# Patient Record
Sex: Male | Born: 1995 | Race: Black or African American | Hispanic: No | Marital: Single | Smoking: Never smoker
Health system: Southern US, Community
[De-identification: ages and names within clinical notes are randomized; demographics above are authoritative.]

---
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
## 2014-03-16 ENCOUNTER — Emergency Department: Payer: Self-pay | Admitting: Emergency Medicine

## 2014-04-23 ENCOUNTER — Encounter (HOSPITAL_COMMUNITY): Payer: Self-pay | Admitting: Emergency Medicine

## 2014-04-23 ENCOUNTER — Emergency Department (HOSPITAL_COMMUNITY)
Admission: EM | Admit: 2014-04-23 | Discharge: 2014-04-23 | Disposition: A | Discharge status: Home / Self Care | Payer: Medicaid Other | Attending: Emergency Medicine | Admitting: Emergency Medicine

## 2014-04-23 DIAGNOSIS — J029 Acute pharyngitis, unspecified: Secondary | ICD-10-CM | POA: Insufficient documentation

## 2014-04-23 LAB — RAPID STREP SCREEN (MED CTR MEBANE ONLY): Streptococcus, Group A Screen (Direct): NEGATIVE

## 2014-04-23 MED ORDER — LIDOCAINE VISCOUS 2 % MT SOLN: 20.0000 mL | OROMUCOSAL | Status: DC | PRN

## 2014-04-23 NOTE — ED Provider Notes (Signed)
CSN: [id number]     Arrival date & time 04/23/14  [id number] History  This chart was scribed for a non-physician practitioner, Santiago GladHeather Jazon Jipson PA-C, working with Merrie RoofJohn David Wofford III, * by Julian HyMorgan Graham, ED Scribe. The patient was seen in TR07C/TR07C. The patient's care was started at 11:15 AM.  Chief Complaint  Patient presents with  . Sore Throat   The history is provided by the patient. No language interpreter was used.   HPI Comments: Brian Simpson is a 18 y.o. male who presents to the Emergency Department complaining of gradual onset, constant, gradually worsening sore throat that started 3 days ago. Patient states his symptoms initially began as an itchy throat and progressed to pain. He reports today he noticed white spots to the posterior oropharynx. He has associated left-sided otalgia and subjective fever several days ago. Pt has taken Tylenol with relief a couple of days ago, but denies taking any medications recently. Pt denies previous episodes of strep throat. Patient reports his girlfriend has similar symptoms but was not diagnosed with strep throat. Pt denies chills, nausea, vomiting, difficulty swallowing, cough, rhinorrhea,congestion, or rash.   History  Substance Use Topics  . Smoking status: Never Smoker   . Smokeless tobacco: Not on file  . Alcohol Use: No    Review of Systems  Constitutional: Negative for fever and chills.  HENT: Positive for sore throat. Negative for drooling, rhinorrhea and trouble swallowing.   Respiratory: Negative for cough.   Gastrointestinal: Negative for nausea and vomiting.  Skin: Negative for rash.  All other systems reviewed and are negative.  Allergies  Review of patient's allergies indicates no known allergies.  Home Medications   Prior to Admission medications   Medication Sig Start Date End Date Taking? Authorizing Provider  PRESCRIPTION MEDICATION Take 1 tablet by mouth daily as needed (chest pain).   Yes Historical Provider, MD    Triage Vitals: BP 115/57  Pulse 65  Temp(Src) 97.4 F (36.3 C) (Oral)  Resp 20  SpO2 100%  Physical Exam  Nursing note and vitals reviewed. Constitutional: He is oriented to person, place, and time. He appears well-developed and well-nourished. No distress.  HENT:  Head: Normocephalic and atraumatic.  Right Ear: Tympanic membrane and ear canal normal.  Left Ear: Tympanic membrane and ear canal normal.  Mouth/Throat: Uvula is midline. No trismus in the jaw. Oropharyngeal exudate (on both tonsils) present.  Polyp noted to the left nare. Bilateral tonsillar enlargement. Exudate present on bilateral tonsils. Normal voice phonation. Not drooling, handling secretions well. Anterior cervical lymphadenopathy noted.    Eyes: Conjunctivae and EOM are normal.  Neck: Neck supple. No tracheal deviation present.  Cardiovascular: Normal rate, regular rhythm and normal heart sounds.   Pulmonary/Chest: Effort normal and breath sounds normal. No respiratory distress.  Musculoskeletal: Normal range of motion.  Lymphadenopathy:    He has cervical adenopathy.  Neurological: He is alert and oriented to person, place, and time.  Skin: Skin is warm and dry.  Psychiatric: He has a normal mood and affect. His behavior is normal.    ED Course  Procedures (including critical care time) DIAGNOSTIC STUDIES: Oxygen Saturation is 100% on room air, normal by my interpretation.    COORDINATION OF CARE: 11:19 AM- Patient informed of current plan for treatment and evaluation and agrees with plan at this time.  Labs Review Labs Reviewed  RAPID STREP SCREEN  CULTURE, GROUP A STREP   MDM   Final diagnoses:  None    Patient  presenting with sore throat.  Patient is afebrile.  Rapid strep is negative. Culture sent.   No signs of Peritonsillar or Retropharyngeal Abscess.  Patient stable for discharge.  Return precautions given.   Santiago GladHeather Bentley Haralson, PA-C 04/24/14 2326

## 2014-04-23 NOTE — ED Notes (Signed)
Patient states that he has sore throat with "white patchy stuff in the back".  Patient states his girlfriend recently had strep.

## 2014-04-25 LAB — CULTURE, GROUP A STREP

## 2014-04-25 NOTE — ED Provider Notes (Signed)
Medical screening examination/treatment/procedure(s) were performed by non-physician practitioner and as supervising physician I was immediately available for consultation/collaboration.   Candyce ChurnJohn David Wofford III, MD 04/25/14 [phone]

## 2014-06-07 ENCOUNTER — Emergency Department: Payer: Self-pay | Admitting: Emergency Medicine

## 2014-07-31 ENCOUNTER — Emergency Department (HOSPITAL_COMMUNITY)
Admission: EM | Admit: 2014-07-31 | Discharge: 2014-07-31 | Disposition: A | Discharge status: Home / Self Care | Payer: Medicaid Other | Attending: Emergency Medicine | Admitting: Emergency Medicine

## 2014-07-31 ENCOUNTER — Encounter (HOSPITAL_COMMUNITY): Payer: Self-pay | Admitting: Emergency Medicine

## 2014-07-31 DIAGNOSIS — Z791 Long term (current) use of non-steroidal anti-inflammatories (NSAID): Secondary | ICD-10-CM | POA: Insufficient documentation

## 2014-07-31 DIAGNOSIS — R197 Diarrhea, unspecified: Secondary | ICD-10-CM | POA: Diagnosis not present

## 2014-07-31 DIAGNOSIS — R112 Nausea with vomiting, unspecified: Secondary | ICD-10-CM | POA: Diagnosis not present

## 2014-07-31 DIAGNOSIS — Z79899 Other long term (current) drug therapy: Secondary | ICD-10-CM | POA: Insufficient documentation

## 2014-07-31 DIAGNOSIS — S3992XA Unspecified injury of lower back, initial encounter: Secondary | ICD-10-CM | POA: Diagnosis present

## 2014-07-31 DIAGNOSIS — S39012A Strain of muscle, fascia and tendon of lower back, initial encounter: Secondary | ICD-10-CM

## 2014-07-31 DIAGNOSIS — X58XXXA Exposure to other specified factors, initial encounter: Secondary | ICD-10-CM | POA: Insufficient documentation

## 2014-07-31 DIAGNOSIS — S335XXA Sprain of ligaments of lumbar spine, initial encounter: Secondary | ICD-10-CM | POA: Diagnosis not present

## 2014-07-31 LAB — COMPREHENSIVE METABOLIC PANEL
ALBUMIN: 3.9 g/dL (ref 3.5–5.2)
ALK PHOS: 48 U/L (ref 39–117)
ALT: 18 U/L (ref 0–53)
AST: 20 U/L (ref 0–37)
Anion gap: 12 (ref 5–15)
BUN: 16 mg/dL (ref 6–23)
CO2: 26 mEq/L (ref 19–32)
Calcium: 9.2 mg/dL (ref 8.4–10.5)
Chloride: 102 mEq/L (ref 96–112)
Creatinine, Ser: 1.05 mg/dL (ref 0.50–1.35)
GFR calc Af Amer: >90 mL/min (ref >90)
GFR calc non Af Amer: >90 mL/min (ref >90)
GLUCOSE: 136 mg/dL — AB (ref 70–99)
Potassium: 3.3 mEq/L — ABNORMAL LOW (ref 3.7–5.3)
SODIUM: 140 meq/L (ref 137–147)
TOTAL PROTEIN: 7.2 g/dL (ref 6.0–8.3)
Total Bilirubin: 1.2 mg/dL (ref 0.3–1.2)

## 2014-07-31 LAB — CBC WITH DIFFERENTIAL/PLATELET
Basophils Absolute: 0 10*3/uL (ref 0.0–0.1)
Basophils Relative: 0 % (ref 0–1)
EOS ABS: 0.1 10*3/uL (ref 0.0–0.7)
Eosinophils Relative: 2 % (ref 0–5)
HCT: 41.7 % (ref 39.0–52.0)
Hemoglobin: 14.3 g/dL (ref 13.0–17.0)
LYMPHS ABS: 1 10*3/uL (ref 0.7–4.0)
Lymphocytes Relative: 20 % (ref 12–46)
MCH: 27.9 pg (ref 26.0–34.0)
MCHC: 34.3 g/dL (ref 30.0–36.0)
MCV: 81.3 fL (ref 78.0–100.0)
Monocytes Absolute: 0.5 10*3/uL (ref 0.1–1.0)
Monocytes Relative: 10 % (ref 3–12)
NEUTROS PCT: 68 % (ref 43–77)
Neutro Abs: 3.3 10*3/uL (ref 1.7–7.7)
PLATELETS: 212 10*3/uL (ref 150–400)
RBC: 5.13 MIL/uL (ref 4.22–5.81)
RDW: 13 % (ref 11.5–15.5)
WBC: 4.8 10*3/uL (ref 4.0–10.5)

## 2014-07-31 LAB — URINALYSIS, ROUTINE W REFLEX MICROSCOPIC
Bilirubin Urine: NEGATIVE
GLUCOSE, UA: NEGATIVE mg/dL
HGB URINE DIPSTICK: NEGATIVE
Ketones, ur: NEGATIVE mg/dL
Leukocytes, UA: NEGATIVE
Nitrite: NEGATIVE
Protein, ur: NEGATIVE mg/dL
SPECIFIC GRAVITY, URINE: 1.022 (ref 1.005–1.030)
Urobilinogen, UA: 4 mg/dL — ABNORMAL HIGH (ref 0.0–1.0)
pH: 6.5 (ref 5.0–8.0)

## 2014-07-31 LAB — LIPASE, BLOOD: Lipase: 11 U/L (ref 11–59)

## 2014-07-31 MED ORDER — POTASSIUM CHLORIDE CRYS ER 20 MEQ PO TBCR
40.0000 meq | EXTENDED_RELEASE_TABLET | Freq: Once | ORAL | Status: AC
  Administered 2014-07-31: 40 meq via ORAL
  Filled 2014-07-31: qty 2

## 2014-07-31 MED ORDER — NAPROXEN 500 MG PO TABS
500.0000 mg | ORAL_TABLET | Freq: Two times a day (BID) | ORAL | Status: AC
Start: 2014-07-31 — End: ?

## 2014-07-31 MED ORDER — CYCLOBENZAPRINE HCL 10 MG PO TABS: 10.0000 mg | ORAL_TABLET | Freq: Two times a day (BID) | ORAL | Status: AC | PRN

## 2014-07-31 NOTE — ED Provider Notes (Signed)
CSN: [id number]     Arrival date & time 07/31/14  1317 History   First MD Initiated Contact with Patient 07/31/14 1521     Chief Complaint  Patient presents with  . Emesis  . Diarrhea  . Back Pain     (Consider location/radiation/quality/duration/timing/severity/associated sxs/prior Treatment) HPI  Pt. presents today with complaints of N/V/D and central back pain. Nausea, vomiting and diarrhea started yesterday with one episode of vomiting and five episodes of diarrhea. Vomiting has completely stopped but notes one episode of diarrhea this morning. Diarrhea is watery with out blood present. He was able to keep down liquids and food today. Brother in law was recently sick with the same thing. Back pain started when he started to feel sick, it is located in lumbar region centrally and sometimes travels up his back to thoracic region. 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 Pain in back is about a 7 today and gets worse with movement. Has tried tylenol over the counter with little relief. Denies numbness or tingling in legs or loss of bowel and bladder control.    History reviewed. No pertinent past medical history. History reviewed. No pertinent past surgical history. History reviewed. No pertinent family history. History  Substance Use Topics  . Smoking status: Never Smoker   . Smokeless tobacco: Not on file  . Alcohol Use: No    Review of Systems  All other systems reviewed and are negative.     Allergies  Review of patient's allergies indicates no known allergies.  Home Medications   Prior to Admission medications   Medication Sig Start Date End Date Taking? Authorizing Provider  bismuth subsalicylate (PEPTO BISMOL) 262 MG/15ML suspension Take 30 mLs by mouth every 6 (six) hours as needed for indigestion.   Yes Historical Provider, MD  cyclobenzaprine (FLEXERIL) 10 MG tablet Take 1 tablet (10 mg total) by mouth 2 (two)  times daily as needed for muscle spasms. 07/31/14   Cia Garretson Irine Seal, PA-C  naproxen (NAPROSYN) 500 MG tablet Take 1 tablet (500 mg total) by mouth 2 (two) times daily. 07/31/14   Adib Wahba Irine Seal, PA-C   BP 128/73  Pulse 86  Temp(Src) 98.5 F (36.9 C) (Oral)  Resp 15  Ht 5\' 8"  (1.727 m)  Wt 180 lb (81.647 kg)  BMI 27.38 kg/m2  SpO2 97% Physical Exam  Nursing note and vitals reviewed. Constitutional: He appears well-developed and well-nourished. No distress.  HENT:  Head: Normocephalic and atraumatic.  Eyes: Pupils are equal, round, and reactive to light.  Neck: Normal range of motion. Neck supple.  Cardiovascular: Normal rate and regular rhythm.   Pulmonary/Chest: Effort normal and breath sounds normal.  Abdominal: Soft. Bowel sounds are normal. He exhibits no distension and no fluid wave. There is no tenderness. There is no rebound, no guarding and no CVA tenderness.  Musculoskeletal:       Arms: Pt has equal strength to bilateral lower extremities.  Neurosensory function adequate to both legs No clonus on dorsiflextion Skin color is normal. Skin is warm and moist.  I see no step off deformity, no midline bony tenderness.  Pt is able to ambulate.  No crepitus, laceration, effusion, induration, lesions, swelling.   Pedal pulses are symmetrical and palpable bilaterally  mild tenderness to palpation of thoracic and lumbar paraspinal muscles   Neurological: He is alert.  Skin: Skin is warm and dry.    ED Course  Procedures (including critical care time) Labs Review Labs Reviewed  COMPREHENSIVE METABOLIC PANEL - Abnormal; Notable for the following:    Potassium 3.3 (*)    Glucose, Bld 136 (*)    All other components within normal limits  URINALYSIS, ROUTINE W REFLEX MICROSCOPIC - Abnormal; Notable for the following:    Color, Urine AMBER (*)    Urobilinogen, UA 4.0 (*)    All other components within normal limits  CBC WITH DIFFERENTIAL  LIPASE, BLOOD    Imaging  Review No results found.   EKG Interpretation None      MDM   Final diagnoses:  Nausea vomiting and diarrhea  Low back strain, initial encounter    The patient had enteritis like symptoms yesterday that have since resolved. HE denies any belly pain and has a normally abdominal exam. He reports that he can not miss anymore days of work and that his low back pain will prevent him from liftng heavy boxes. He requests a work note. His urolobilirubin is elevated on urine test. But his LFTs and Lipase are normal. I have recommended he have his PCP recheck this. At this time I do not feel that further emergent work up is required.   Medications  potassium chloride SA (K-DUR,KLOR-CON) CR tablet 40 mEq (not administered)   cyclobenzaprine (FLEXERIL) 10 MG tablet Take 1 tablet (10 mg total) by mouth 2 (two) times daily as needed for muscle spasms. 20 tablet Dorthula Matasiffany G Dequane Strahan, PA-C   naproxen (NAPROSYN) 500 MG tablet Take 1 tablet (500 mg total) by mouth 2 (two) times daily. 30 tablet Dorthula Matasiffany G Toshio Slusher, PA-C   18 y.o.Sissy Hofferry N Sizemore's evaluation in the Emergency Department is complete. It has been determined that no acute conditions requiring further emergency intervention are present at this time. The patient/guardian have been advised of the diagnosis and plan. We have discussed signs and symptoms that warrant return to the ED, such as changes or worsening in symptoms.  Vital signs are stable at discharge. Filed Vitals:   07/31/14 1328  BP: 128/73  Pulse: 86  Temp: 98.5 F (36.9 C)  Resp: 15    Patient/guardian has voiced understanding and agreed to follow-up with the PCP or specialist.   Dorthula Matasiffany G Shannel Zahm, PA-C 07/31/14 1637

## 2014-07-31 NOTE — Discharge Instructions (Signed)
Back Pain, Adult °Low back pain is very common. About 1 in 5 people have back pain. The cause of low back pain is rarely dangerous. The pain often gets better over time. About half of people with a sudden onset of back pain feel better in just 2 weeks. About 8 in 10 people feel better by 6 weeks.  °CAUSES °Some common causes of back pain include: °· Strain of the muscles or ligaments supporting the spine. °· Wear and tear (degeneration) of the spinal discs. °· Arthritis. °· Direct injury to the back. °DIAGNOSIS °Most of the time, the direct cause of low back pain is not known. However, back pain can be treated effectively even when the exact cause of the pain is unknown. Answering your caregiver's questions about your overall health and symptoms is one of the most accurate ways to make sure the cause of your pain is not dangerous. If your caregiver needs more information, he or she may order lab work or imaging tests (X-rays or MRIs). However, even if imaging tests show changes in your back, this usually does not require surgery. °HOME CARE INSTRUCTIONS °For many people, back pain returns. Since low back pain is rarely dangerous, it is often a condition that people can learn to manage on their own.  °· Remain active. It is stressful on the back to sit or stand in one place. Do not sit, drive, or stand in one place for more than 30 minutes at a time. Take short walks on level surfaces as soon as pain allows. Try to increase the length of time you walk each day. °· Do not stay in bed. Resting more than 1 or 2 days can delay your recovery. °· Do not avoid exercise or work. Your body is made to move. It is not dangerous to be active, even though your back may hurt. Your back will likely heal faster if you return to being active before your pain is gone. °· Pay attention to your body when you  bend and lift. Many people have less discomfort when lifting if they bend their knees, keep the load close to their bodies, and  avoid twisting. Often, the most comfortable positions are those that put less stress on your recovering back. °· Find a comfortable position to sleep. Use a firm mattress and lie on your side with your knees slightly bent. If you lie on your back, put a pillow under your knees. °· Only take over-the-counter or prescription medicines as directed by your caregiver. Over-the-counter medicines to reduce pain and inflammation are often the most helpful. Your caregiver may prescribe muscle relaxant drugs. These medicines help dull your pain so you can more quickly return to your normal activities and healthy exercise. °· Put ice on the injured area. °¨ Put ice in a plastic bag. °¨ Place a towel between your skin and the bag. °¨ Leave the ice on for 15-20 minutes, 03-04 times a day for the first 2 to 3 days. After that, ice and heat may be alternated to reduce pain and spasms. °· Ask your caregiver about trying back exercises and gentle massage. This may be of some benefit. °· Avoid feeling anxious or stressed. Stress increases muscle tension and can worsen back pain. It is important to recognize when you are anxious or stressed and learn ways to manage it. Exercise is a great option. °SEEK MEDICAL CARE IF: °· You have pain that is not relieved with rest or medicine. °· You have pain that does not improve in 1 week. °· You have new symptoms. °· You are generally not feeling well. °SEEK   IMMEDIATE MEDICAL CARE IF:   You have pain that radiates from your back into your legs.  You develop new bowel or bladder control problems.  You have unusual weakness or numbness in your arms or legs.  You develop nausea or vomiting.  You develop abdominal pain.  You feel faint. Document Released: 10/12/2005 Document Revised: 04/12/2012 Document Reviewed: 02/13/2014 [facility] Patient Information 2015 [place], [place]. This information is not intended to replace advice given to you by your health care provider. Make sure you  discuss any questions you have with your health care provider.   Food Choices to Help Relieve Diarrhea When you have diarrhea, the foods you eat and your eating habits are very important. Choosing the right foods and drinks can help relieve diarrhea. Also, because diarrhea can last up to 7 days, you need to replace lost fluids and electrolytes (such as sodium, potassium, and chloride) in order to help prevent dehydration.  WHAT GENERAL GUIDELINES DO I NEED TO FOLLOW?  Slowly drink 1 cup (8 oz) of fluid for each episode of diarrhea. If you are getting enough fluid, your urine will be clear or pale yellow.  Eat starchy foods. Some good choices include white rice, white toast, pasta, low-fiber cereal, baked potatoes (without the skin), saltine crackers, and bagels.  Avoid large servings of any cooked vegetables.  Limit fruit to two servings per day. A serving is  cup or 1 small piece.  Choose foods with less than 2 g of fiber per serving.  Limit fats to less than 8 tsp (38 g) per day.  Avoid fried foods.  Eat foods that have probiotics in them. Probiotics can be found in certain dairy products.  Avoid foods and beverages that may increase the speed at which food moves through the stomach and intestines (gastrointestinal tract). Things to avoid include:  High-fiber foods, such as dried fruit, raw fruits and vegetables, nuts, seeds, and whole grain foods.  Spicy foods and high-fat foods.  Foods and beverages sweetened with high-fructose corn syrup, honey, or sugar alcohols such as xylitol, sorbitol, and mannitol. WHAT FOODS ARE RECOMMENDED? Grains White rice. White, [place], or pita breads (fresh or toasted), including plain rolls, buns, or bagels. White pasta. Saltine, soda, or graham crackers. Pretzels. Low-fiber cereal. Cooked cereals made with water (such as cornmeal, farina, or cream cereals). Plain muffins. Matzo. Melba toast. Zwieback.  Vegetables Potatoes (without the skin).  Strained tomato and vegetable juices. Most well-cooked and canned vegetables without seeds. Tender lettuce. Fruits Cooked or canned applesauce, apricots, cherries, fruit cocktail, grapefruit, peaches, pears, or plums. Fresh bananas, apples without skin, cherries, grapes, cantaloupe, grapefruit, peaches, oranges, or plums.  Meat and Other Protein Products Baked or boiled chicken. Eggs. Tofu. Fish. Seafood. Smooth peanut butter. Ground or well-cooked tender beef, ham, veal, lamb, pork, or poultry.  Dairy Plain yogurt, kefir, and unsweetened liquid yogurt. Lactose-free milk, buttermilk, or soy milk. Plain hard cheese. Beverages Sport drinks. Clear broths. Diluted fruit juices (except prune). Regular, caffeine-free sodas such as ginger ale. Water. Decaffeinated teas. Oral rehydration solutions. Sugar-free beverages not sweetened with sugar alcohols. Other Bouillon, broth, or soups made from recommended foods.  The items listed above may not be a complete list of recommended foods or beverages. Contact your dietitian for more options. WHAT FOODS ARE NOT RECOMMENDED? Grains Whole grain, whole wheat, bran, or rye breads, rolls, pastas, crackers, and cereals. Wild or brown rice. Cereals that contain more than 2 g of fiber per serving. Corn tortillas or taco shells.  Cooked or dry oatmeal. Granola. Popcorn. Vegetables Raw vegetables. Cabbage, broccoli, Brussels sprouts, artichokes, baked beans, beet greens, corn, kale, legumes, peas, sweet potatoes, and yams. Potato skins. Cooked spinach and cabbage. Fruits Dried fruit, including raisins and dates. Raw fruits. Stewed or dried prunes. Fresh apples with skin, apricots, mangoes, pears, raspberries, and strawberries.  Meat and Other Protein Products Chunky peanut butter. Nuts and seeds. Beans and lentils. Tomasa Blase.  Dairy High-fat cheeses. Milk, chocolate milk, and beverages made with milk, such as milk shakes. Cream. Ice cream. Sweets and Desserts Sweet  rolls, doughnuts, and sweet breads. Pancakes and waffles. Fats and Oils Butter. Cream sauces. Margarine. Salad oils. Plain salad dressings. Olives. Avocados.  Beverages Caffeinated beverages (such as coffee, tea, soda, or energy drinks). Alcoholic beverages. Fruit juices with pulp. Prune juice. Soft drinks sweetened with high-fructose corn syrup or sugar alcohols. Other Coconut. Hot sauce. Chili powder. Mayonnaise. Gravy. Cream-based or milk-based soups.  The items listed above may not be a complete list of foods and beverages to avoid. Contact your dietitian for more information. WHAT SHOULD I DO IF I BECOME DEHYDRATED? Diarrhea can sometimes lead to dehydration. Signs of dehydration include dark urine and dry mouth and skin. If you think you are dehydrated, you should rehydrate with an oral rehydration solution. These solutions can be purchased at pharmacies, retail stores, or online.  Drink -1 cup (120-240 mL) of oral rehydration solution each time you have an episode of diarrhea. If drinking this amount makes your diarrhea worse, try drinking smaller amounts more often. For example, drink 1-3 tsp (5-15 mL) every 5-10 minutes.  A general rule for staying hydrated is to drink 1-2 L of fluid per day. Talk to your health care provider about the specific amount you should be drinking each day. Drink enough fluids to keep your urine clear or pale yellow. Document Released: 01/02/2004 Document Revised: 10/17/2013 Document Reviewed: 09/04/2013 [facility] Patient Information 2015 [place], [place]. This information is not intended to replace advice given to you by your health care provider. Make sure you discuss any questions you have with your health care provider.

## 2014-07-31 NOTE — ED Notes (Signed)
Pt c/o N/V/D from possible stomach virus; pt sts pain in back also

## 2014-08-01 NOTE — ED Provider Notes (Signed)
Medical screening examination/treatment/procedure(s) were performed by non-physician practitioner and as supervising physician I was immediately available for consultation/collaboration.   EKG Interpretation None        Brelyn Woehl, MD 08/01/14 1149 

## 2016-05-20 IMAGING — CR DG CHEST 2V
1 series · 2 of 2 positions shown · non-contrast
Comparison: None.

CLINICAL DATA: Chest tightness

EXAM:
CHEST  2 VIEW

[Series 1: w chest pa · 0.14mm/px · 2 of 2 slices shown]
[im 1/2]
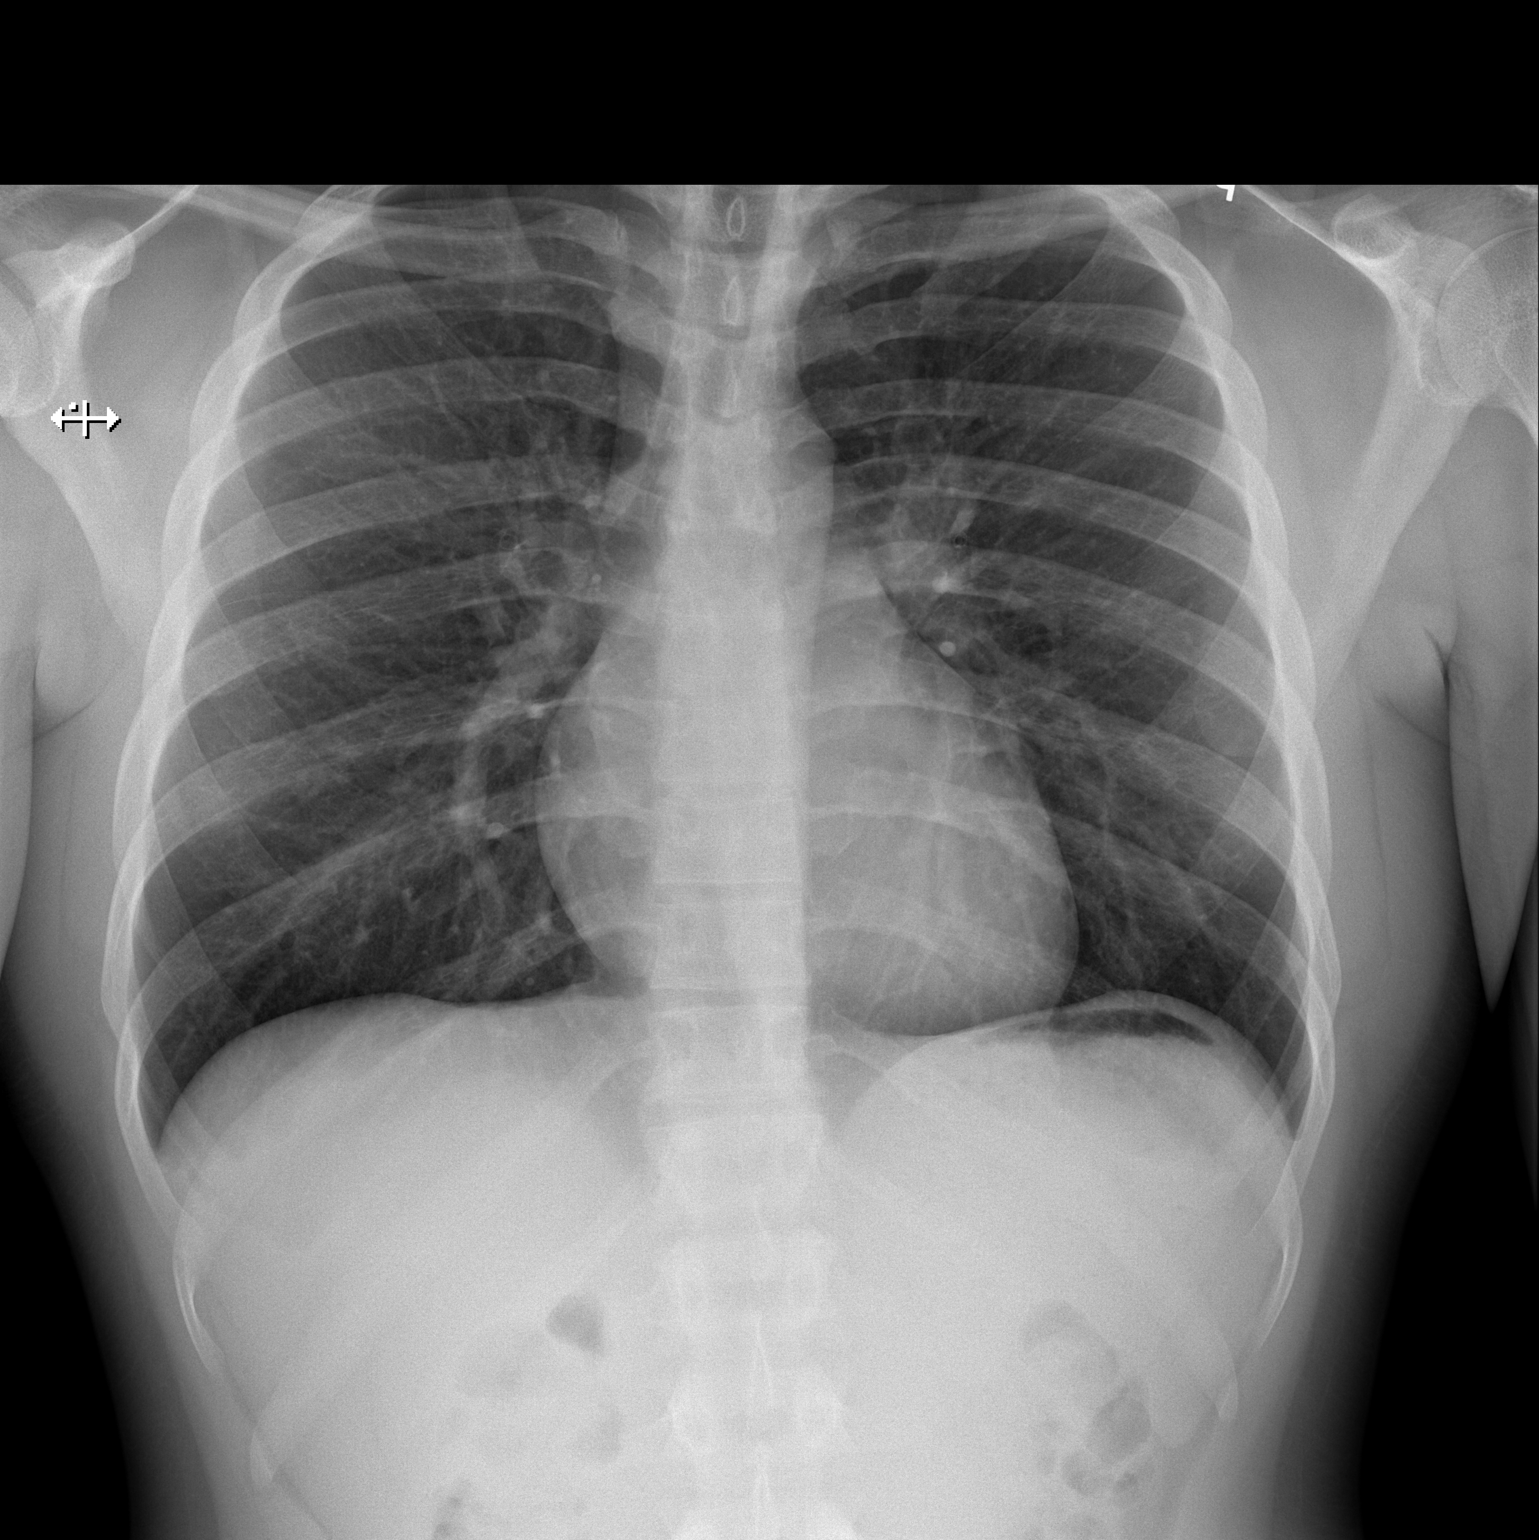
[im 2/2]
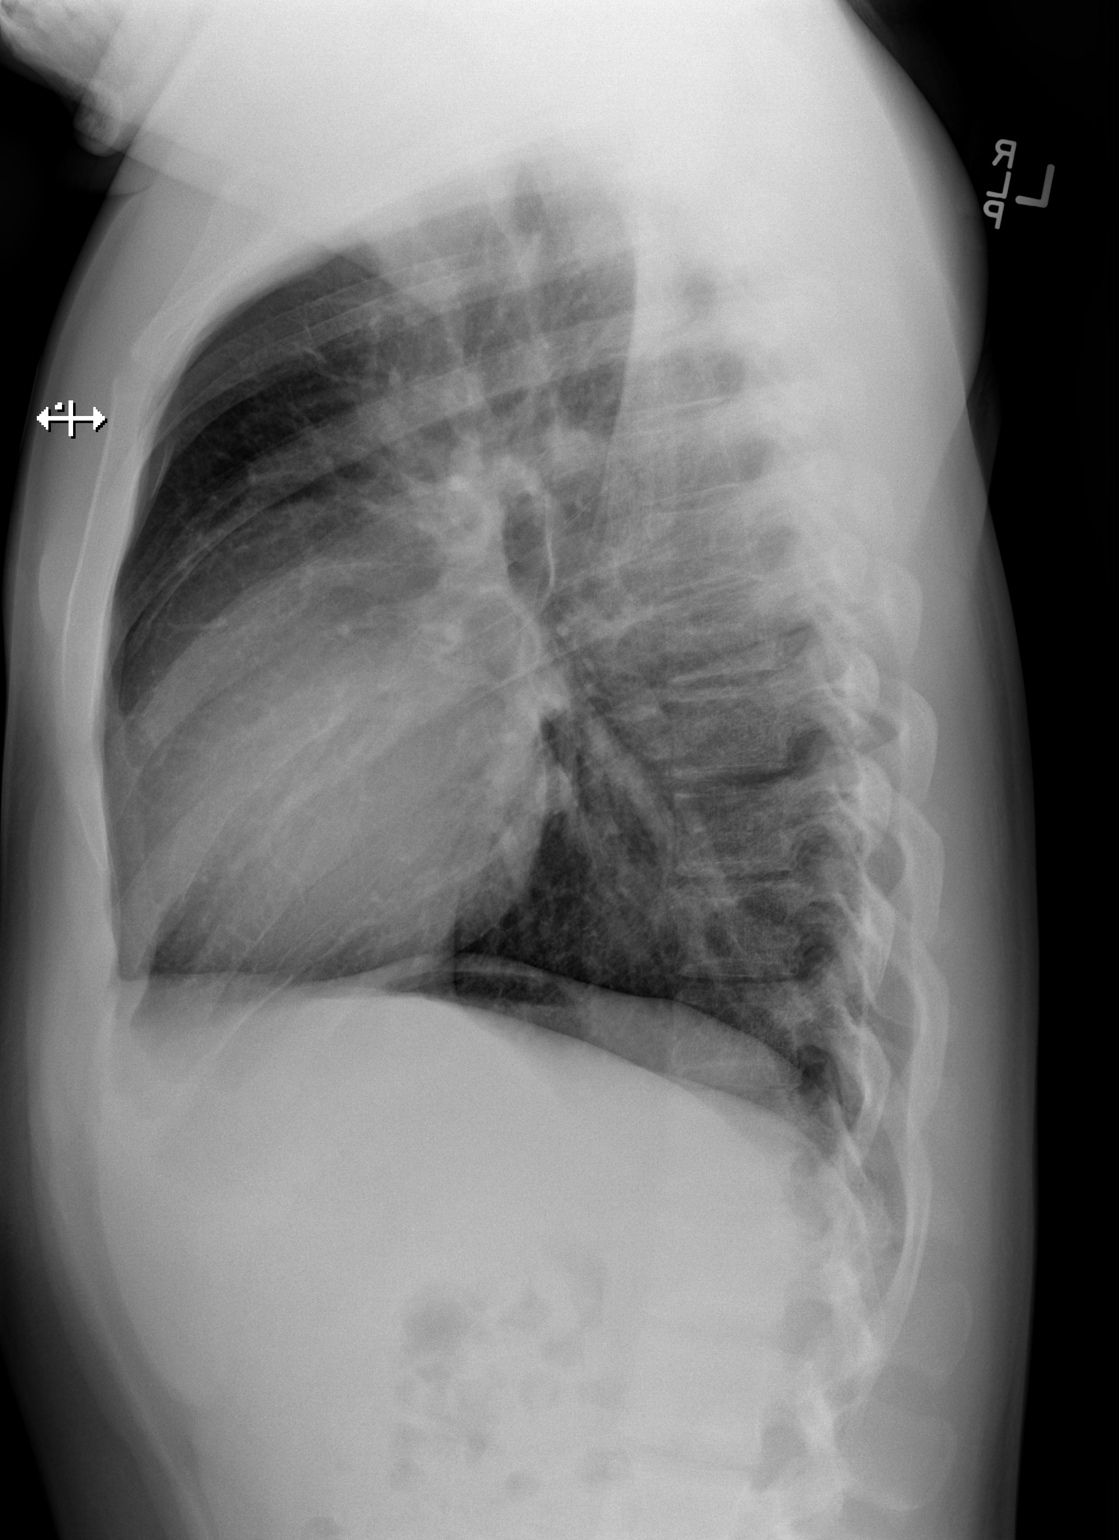

[2 of 2 positions shown; findings below may reference images not displayed]

FINDINGS: The lungs are clear. Heart size and pulmonary vascularity are
normal. No adenopathy. No pneumothorax. No bone lesions.
IMPRESSION: No abnormality noted.

## 2016-06-30 ENCOUNTER — Emergency Department (HOSPITAL_COMMUNITY)
Admission: EM | Admit: 2016-06-30 | Discharge: 2016-07-01 | Disposition: A | Discharge status: Home / Self Care | Payer: Medicaid Other | Attending: Emergency Medicine | Admitting: Emergency Medicine

## 2016-06-30 ENCOUNTER — Encounter (HOSPITAL_COMMUNITY): Payer: Self-pay

## 2016-06-30 DIAGNOSIS — R110 Nausea: Secondary | ICD-10-CM | POA: Insufficient documentation

## 2016-06-30 DIAGNOSIS — R1032 Left lower quadrant pain: Secondary | ICD-10-CM | POA: Insufficient documentation

## 2016-06-30 DIAGNOSIS — R109 Unspecified abdominal pain: Secondary | ICD-10-CM

## 2016-06-30 LAB — CBC
HEMATOCRIT: 41.9 % (ref 39.0–52.0)
Hemoglobin: 14.2 g/dL (ref 13.0–17.0)
MCH: 26.4 pg (ref 26.0–34.0)
MCHC: 33.9 g/dL (ref 30.0–36.0)
MCV: 78 fL (ref 78.0–100.0)
Platelets: 251 10*3/uL (ref 150–400)
RBC: 5.37 MIL/uL (ref 4.22–5.81)
RDW: 13.8 % (ref 11.5–15.5)
WBC: 10.5 10*3/uL (ref 4.0–10.5)

## 2016-06-30 LAB — COMPREHENSIVE METABOLIC PANEL
ALT: 33 U/L (ref 17–63)
AST: 39 U/L (ref 15–41)
Albumin: 4.5 g/dL (ref 3.5–5.0)
Alkaline Phosphatase: 48 U/L (ref 38–126)
Anion gap: 8 (ref 5–15)
BILIRUBIN TOTAL: 0.4 mg/dL (ref 0.3–1.2)
BUN: 11 mg/dL (ref 6–20)
CHLORIDE: 106 mmol/L (ref 101–111)
CO2: 25 mmol/L (ref 22–32)
CREATININE: 0.88 mg/dL (ref 0.61–1.24)
Calcium: 9.5 mg/dL (ref 8.9–10.3)
GFR calc Af Amer: >60 mL/min (ref >60)
GFR calc non Af Amer: >60 mL/min (ref >60)
Glucose, Bld: 114 mg/dL — ABNORMAL HIGH (ref 65–99)
Potassium: 3.4 mmol/L — ABNORMAL LOW (ref 3.5–5.1)
Sodium: 139 mmol/L (ref 135–145)
Total Protein: 7.7 g/dL (ref 6.5–8.1)

## 2016-06-30 LAB — URINALYSIS, ROUTINE W REFLEX MICROSCOPIC
Bilirubin Urine: NEGATIVE
Glucose, UA: NEGATIVE mg/dL
Hgb urine dipstick: NEGATIVE
Ketones, ur: NEGATIVE mg/dL
LEUKOCYTES UA: NEGATIVE
NITRITE: NEGATIVE
Protein, ur: NEGATIVE mg/dL
SPECIFIC GRAVITY, URINE: 1.021 (ref 1.005–1.030)
pH: 8 (ref 5.0–8.0)

## 2016-06-30 LAB — URINE MICROSCOPIC-ADD ON

## 2016-06-30 LAB — LIPASE, BLOOD: Lipase: 18 U/L (ref 11–51)

## 2016-06-30 MED ORDER — ONDANSETRON HCL 4 MG PO TABS
4.0000 mg | ORAL_TABLET | Freq: Once | ORAL | Status: AC
  Administered 2016-07-01: 4 mg via ORAL
  Filled 2016-06-30: qty 1

## 2016-06-30 NOTE — ED Provider Notes (Signed)
[facility]-EMERGENCY DEPT Provider Note   CSN: [id number] Arrival date & time: 06/30/16  2012     History   Chief Complaint Chief Complaint  Patient presents with  . Abdominal Pain    HPI Brian Simpson is a 20 y.o. male.  HPI   20 year old male presents today with complaints of abdominal pain. Patient reports that around noon today she had approximately gas station. He reports that when eating normally tasted funny to him. 2 hours after eating hot dogs he started experience nausea and left lower quadrant abdominal pain. Patient reports symptoms radiate up to his umbilical region, were severe at first, but have dramatically improved. Patient reports nausea, denies any vomiting or diarrhea. Patient reports that he had normal bowel movement today. Denies any fever or chills. 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 Patient denies any history of the same. Patient otherwise healthy.    History reviewed. No pertinent past medical history.  There are no active problems to display for this patient.   History reviewed. No pertinent surgical history.     Home Medications    Prior to Admission medications   Medication Sig Start Date End Date Taking? Authorizing Provider  cyclobenzaprine (FLEXERIL) 10 MG tablet Take 1 tablet (10 mg total) by mouth 2 (two) times daily as needed for muscle spasms. Patient not taking: Reported on 07/01/2016 07/31/14   Marlon Pel, PA-C  naproxen (NAPROSYN) 500 MG tablet Take 1 tablet (500 mg total) by mouth 2 (two) times daily. Patient not taking: Reported on 07/01/2016 07/31/14   Marlon Pel, PA-C  ondansetron (ZOFRAN) 4 MG tablet Take 1 tablet (4 mg total) by mouth every 6 (six) hours. 07/01/16   Eyvonne Mechanic, PA-C    Family History No family history on file.  Social History Social History  Substance Use Topics  . Smoking status: Never Smoker  . Smokeless tobacco: Never  Used  . Alcohol use No     Allergies   Review of patient's allergies indicates no known allergies.   Review of Systems Review of Systems  All other systems reviewed and are negative.  Physical Exam Updated Vital Signs BP 146/60 (BP Location: Left Arm)   Pulse 100   Temp 97.4 F (36.3 C) (Oral)   Resp 20   Ht 5\' 8"  (1.727 m)   Wt 97.5 kg   SpO2 98%   BMI 32.69 kg/m   Physical Exam  Constitutional: He is oriented to person, place, and time. He appears well-developed and well-nourished.  HENT:  Head: Normocephalic and atraumatic.  Eyes: Conjunctivae are normal. Pupils are equal, round, and reactive to light. Right eye exhibits no discharge. Left eye exhibits no discharge. No scleral icterus.  Neck: Normal range of motion. No JVD present. No tracheal deviation present.  Pulmonary/Chest: Effort normal. No stridor.  Abdominal: Soft. He exhibits no distension and no mass. There is no tenderness. There is no rebound and no guarding. No hernia.  Neurological: He is alert and oriented to person, place, and time. Coordination normal.  Psychiatric: He has a normal mood and affect. His behavior is normal. Judgment and thought content normal.  Nursing note and vitals reviewed.   ED Treatments / Results  Labs (all labs ordered are listed, but only abnormal results are displayed) Labs Reviewed  COMPREHENSIVE METABOLIC PANEL - Abnormal; Notable for the following:       Result Value  Potassium 3.4 (*)    Glucose, Bld 114 (*)    All other components within normal limits  URINALYSIS, ROUTINE W REFLEX MICROSCOPIC (NOT AT [facility]) - Abnormal; Notable for the following:    APPearance TURBID (*)    All other components within normal limits  URINE MICROSCOPIC-ADD ON - Abnormal; Notable for the following:    Squamous Epithelial / LPF 0-5 (*)    Bacteria, UA FEW (*)    All other components within normal limits  LIPASE, BLOOD  CBC    EKG  EKG Interpretation None        Radiology No results found.  Procedures Procedures (including critical care time)  Medications Ordered in ED Medications  ondansetron (ZOFRAN) tablet 4 mg (4 mg Oral Given 07/01/16 0038)     Initial Impression / Assessment and Plan / ED Course  I have reviewed the triage vital signs and the nursing notes.  Pertinent labs & imaging results that were available during my care of the patient were reviewed by me and considered in my medical decision making (see chart for details).  Clinical Course     Final Clinical Impressions(s) / ED Diagnoses   Final diagnoses:  Abdominal pain, unspecified abdominal location   Labs:  Imaging:  Consults:  Therapeutics:  Discharge Meds:   Assessment/Plan:  20 year old male presents today with complaints of abdominal pain. He has no significant abdominal tenderness, has reassuring laboratory analysis, afebrile and nontoxic. Patient will be discharged home with antinausea medication, primary care follow-up, strict return precautions. He verbalizes understanding and agreement to today's plan had no further questions or concerns at time of discharge. Low suspicion for significant ingestion, or significant intra-abdominal infection.     New Prescriptions Discharge Medication List as of 07/01/2016 12:50 AM    START taking these medications   Details  ondansetron (ZOFRAN) 4 MG tablet Take 1 tablet (4 mg total) by mouth every 6 (six) hours., Starting Wed 07/01/2016, Print         Newell RubbermaidJeffrey Dalicia Kisner, PA-C 07/01/16 [id number]    Benjiman CoreNathan Pickering, MD 07/01/16 1719

## 2016-06-30 NOTE — ED Triage Notes (Signed)
Pt transported from home by EMS, pt c/o abd pain x 3 hours, +nausea, denies v/d to EMS. Pt last at hot dogs from a gas station.

## 2016-07-01 MED ORDER — ONDANSETRON HCL 4 MG PO TABS: 4.0000 mg | ORAL_TABLET | Freq: Four times a day (QID) | ORAL | 0 refills | Status: AC

## 2016-07-01 NOTE — Discharge Instructions (Addendum)
Please read attached information. If you experience any new or worsening signs or symptoms please return to the emergency room for evaluation. Please follow-up with your primary care provider or specialist as discussed. Please use medication prescribed only as directed and discontinue taking if you have any concerning signs or symptoms.   °

## 2022-10-26 ENCOUNTER — Ambulatory Visit
Admission: EM | Admit: 2022-10-26 | Discharge: 2022-10-26 | Disposition: A | Discharge status: Home / Self Care | Payer: Self-pay | Attending: Urgent Care | Admitting: Urgent Care

## 2022-10-26 DIAGNOSIS — R070 Pain in throat: Secondary | ICD-10-CM | POA: Insufficient documentation

## 2022-10-26 DIAGNOSIS — R0982 Postnasal drip: Secondary | ICD-10-CM | POA: Insufficient documentation

## 2022-10-26 DIAGNOSIS — J018 Other acute sinusitis: Secondary | ICD-10-CM | POA: Insufficient documentation

## 2022-10-26 LAB — POCT RAPID STREP A (OFFICE): Rapid Strep A Screen: NEGATIVE

## 2022-10-26 LAB — POCT MONO SCREEN (KUC): Mono, POC: NEGATIVE

## 2022-10-26 MED ORDER — CETIRIZINE HCL 10 MG PO TABS: 10.0000 mg | ORAL_TABLET | Freq: Every day | ORAL | 0 refills | Status: AC

## 2022-10-26 MED ORDER — AMOXICILLIN 875 MG PO TABS: 875.0000 mg | ORAL_TABLET | Freq: Two times a day (BID) | ORAL | 0 refills | Status: AC

## 2022-10-26 MED ORDER — IPRATROPIUM BROMIDE 0.03 % NA SOLN: 2.0000 | Freq: Two times a day (BID) | NASAL | 0 refills | Status: AC

## 2022-10-26 NOTE — ED Provider Notes (Signed)
[facility]  Note:  This document was prepared using Systems analyst and may include unintentional dictation errors.  MRN: [id number] DOB: [birth year]  Subjective:   Brian Simpson is a 27 y.o. male presenting for 4-week history of acute onset persistent throat congestion, painful swallowing, sinus congestion and drainage.  Has felt fatigue and bodyaches as well.  Patient has been using mouthwash.  No cough, chest pain, shortness of breath or wheezing, fevers.  Patient had unprotected oral sex a couple of months ago or longer.  He is not opposed to STI testing.  No current facility-administered medications for this encounter.  Current Outpatient Medications:    cyclobenzaprine (FLEXERIL) 10 MG tablet, Take 1 tablet (10 mg total) by mouth 2 (two) times daily as needed for muscle spasms. (Patient not taking: Reported on 07/01/2016), Disp: 20 tablet, Rfl: 0   naproxen (NAPROSYN) 500 MG tablet, Take 1 tablet (500 mg total) by mouth 2 (two) times daily. (Patient not taking: Reported on 07/01/2016), Disp: 30 tablet, Rfl: 0   ondansetron (ZOFRAN) 4 MG tablet, Take 1 tablet (4 mg total) by mouth every 6 (six) hours., Disp: 12 tablet, Rfl: 0   No Known Allergies  History reviewed. No pertinent past medical history.   History reviewed. No pertinent surgical history.  History reviewed. No pertinent family history.  Social History   Tobacco Use   Smoking status: Never   Smokeless tobacco: Never  Substance Use Topics   Alcohol use: No   Drug use: No    ROS   Objective:   Vitals: BP 138/85 (BP Location: Right Arm)   Pulse 84   Temp 98 F (36.7 C) (Oral)   Resp 16   SpO2 97%   Physical Exam Constitutional:      General: He is not in acute distress.    Appearance: Normal appearance. He is well-developed and normal weight. He is not ill-appearing, toxic-appearing or diaphoretic.  HENT:     Head: Normocephalic and atraumatic.     Right Ear:  External ear normal.     Left Ear: External ear normal.     Nose: Nose normal.     Mouth/Throat:     Pharynx: No pharyngeal swelling, oropharyngeal exudate, posterior oropharyngeal erythema or uvula swelling.     Tonsils: No tonsillar exudate or tonsillar abscesses. 0 on the right. 0 on the left.  Eyes:     General: No scleral icterus.       Right eye: No discharge.        Left eye: No discharge.     Extraocular Movements: Extraocular movements intact.  Cardiovascular:     Rate and Rhythm: Normal rate.  Pulmonary:     Effort: Pulmonary effort is normal.  Musculoskeletal:     Cervical back: Normal range of motion.  Neurological:     Mental Status: He is alert and oriented to person, place, and time.  Psychiatric:        Mood and Affect: Mood normal.        Behavior: Behavior normal.        Thought Content: Thought content normal.        Judgment: Judgment normal.     Results for orders placed or performed during the hospital encounter of 10/26/22 (from the past 24 hour(s))  POCT rapid strep A     Status: None   Collection Time: 10/26/22  8:58 AM  Result Value Ref Range   Rapid Strep A  Screen Negative Negative  POCT mono screen     Status: None   Collection Time: 10/26/22  9:23 AM  Result Value Ref Range   Mono, POC Negative Negative    Assessment and Plan :   PDMP not reviewed this encounter.  1. Acute non-recurrent sinusitis of other sinus   2. Throat pain   3. Post-nasal drainage     Will start empiric treatment for sinusitis with amoxicillin.  Recommended supportive care otherwise.  Deferred strep culture given the use of amoxicillin.  Oral cytology pending.  Counseled patient on potential for adverse effects with medications prescribed/recommended today, ER and return-to-clinic precautions discussed, patient verbalized understanding.    Jaynee Eagles, PA-C 10/26/22 1010

## 2022-10-26 NOTE — ED Triage Notes (Signed)
Pt states sore/itchy throat for the past 4 weeks. States he was gargling with mouth wash.

## 2022-10-29 LAB — CYTOLOGY, (ORAL, ANAL, URETHRAL) ANCILLARY ONLY
Chlamydia: NEGATIVE
Comment: NEGATIVE
Comment: NEGATIVE
Comment: NORMAL
Neisseria Gonorrhea: NEGATIVE
Trichomonas: NEGATIVE
